# Patient Record
Sex: Male | Born: 2000 | Race: White | Hispanic: No | Marital: Single
Health system: Southern US, Community
[De-identification: ages and names within clinical notes are randomized; demographics above are authoritative.]

## PROBLEM LIST (undated history)

## (undated) DIAGNOSIS — L709 Acne, unspecified: Secondary | ICD-10-CM

## (undated) HISTORY — DX: Acne, unspecified: L70.9

---
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
## 2021-01-09 ENCOUNTER — Ambulatory Visit: Payer: BC Managed Care – PPO | Admitting: Dermatology

## 2021-01-09 ENCOUNTER — Other Ambulatory Visit: Payer: Self-pay

## 2021-01-09 VITALS — Wt 145.0 lb

## 2021-01-09 DIAGNOSIS — L853 Xerosis cutis: Secondary | ICD-10-CM | POA: Diagnosis not present

## 2021-01-09 DIAGNOSIS — L700 Acne vulgaris: Secondary | ICD-10-CM | POA: Diagnosis not present

## 2021-01-09 DIAGNOSIS — K130 Diseases of lips: Secondary | ICD-10-CM | POA: Diagnosis not present

## 2021-01-09 DIAGNOSIS — Z79899 Other long term (current) drug therapy: Secondary | ICD-10-CM

## 2021-01-09 MED ORDER — ISOTRETINOIN 30 MG PO CAPS: 60.0000 mg | ORAL_CAPSULE | Freq: Every day | ORAL | 0 refills | Status: DC

## 2021-01-09 NOTE — Patient Instructions (Signed)

## 2021-01-09 NOTE — Progress Notes (Signed)
   Follow-Up Visit   Subjective  Brady Clark is a 20 y.o. male who presents for the following: Acne (Isotretinoin 30mg  2 po QD - patient states that he has no side effects at this time).  The following portions of the chart were reviewed this encounter and updated as appropriate:   Allergies  Meds  Problems  Med Hx  Surg Hx  Fam Hx     Review of Systems:  No other skin or systemic complaints except as noted in HPI or Assessment and Plan.  Objective  Well appearing patient in no apparent distress; mood and affect are within normal limits.  A focused examination was performed including the face. Relevant physical exam findings are noted in the Assessment and Plan.  Objective  Face: One resolving papule and one small active papule.   Assessment & Plan  Acne vulgaris Face Isotretinoin week 20 - Acne is chronic, severe, not at goal. Severe; On Isotretinoin -  requiring FDA mandated monthly evaluations and laboratory monitoring; Chronic and Persistent; Not to Goal  While taking isotretinoin, do not share pills and do not donate blood. Isotretinoin is best absorbed when taken with a fatty meal. Isotretinoin can make you sensitive to the sun. Daily careful sun protection including sunscreen SPF 30+ when outdoors is recommended.   Continue Isotretinoin 30mg  2 po QD take with food. Recommend continuing treatment for at least 2 more months.   Total dose 95.7mg /kg - discussed with patient ideally he should reach at least 125mg /kg before stopping treatment.   Reordered Medications ISOtretinoin (ACCUTANE) 30 MG capsule   Isotretinoin F/U - 01/09/21 1000      Isotretinoin Follow Up   iPledge #    Date 01/09/21    Weight 145 lb (65.8 kg)    Acne breakouts since last visit? Yes      Dosage   Target Dosage (mg) 01/11/21    Current (To Date) Dosage (mg) 6,300    To Go Dosage (mg) 3,585      Side Effects   Skin WNL    Gastrointestinal WNL    Neurological WNL     Constitutional WNL          Cheilitis Secondary to Isotretinoin  - Continue lip balm as directed, Dr. [phone] Cortibalm or Aquaphor recommended - Continue HC 2.5% ointment QD PRN.  Xerosis Secondary to Isotretinoin  - diffuse xerotic patches - recommend gentle, hydrating skin care - gentle skin care handout given  Return in about 1 month (around 02/08/2021) for Isotretinoin follow up .  8,341, CMA, am acting as scribe for Clayborne Artist, MD .  Documentation: I have reviewed the above documentation for accuracy and completeness, and I agree with the above.  02/10/2021, MD

## 2021-01-11 ENCOUNTER — Encounter: Payer: Self-pay | Admitting: Dermatology

## 2021-02-11 ENCOUNTER — Ambulatory Visit: Payer: BC Managed Care – PPO | Admitting: Dermatology

## 2021-02-11 ENCOUNTER — Other Ambulatory Visit: Payer: Self-pay

## 2021-02-11 VITALS — Wt 145.0 lb

## 2021-02-11 DIAGNOSIS — Z79899 Other long term (current) drug therapy: Secondary | ICD-10-CM

## 2021-02-11 DIAGNOSIS — K130 Diseases of lips: Secondary | ICD-10-CM | POA: Diagnosis not present

## 2021-02-11 DIAGNOSIS — L853 Xerosis cutis: Secondary | ICD-10-CM

## 2021-02-11 DIAGNOSIS — L700 Acne vulgaris: Secondary | ICD-10-CM

## 2021-02-11 MED ORDER — ISOTRETINOIN 30 MG PO CAPS: 60.0000 mg | ORAL_CAPSULE | Freq: Every day | ORAL | 0 refills | Status: DC

## 2021-02-11 NOTE — Progress Notes (Signed)
   Isotretinoin Follow-Up Visit   Subjective  Brady Clark is a 20 y.o. male who presents for the following: Acne (Isotretinoin week 24 - 30mg  2 po QD, patient c/o hip/joint pain ).  Week # 24   Isotretinoin F/U - 02/11/21 1600      Isotretinoin Follow Up   iPledge # 02/13/21    Date 02/11/21    Weight 145 lb (65.8 kg)    Acne breakouts since last visit? No      Dosage   Target Dosage (mg) 9885    Current (To Date) Dosage (mg) 8,100    To Go Dosage (mg) 1,735      Side Effects   Skin WNL    Gastrointestinal WNL    Neurological WNL    Constitutional Other   hip/joint pain           Side effects: Dry skin, dry lips  Denies changes in night vision, shortness of breath, abdominal pain, nausea, vomiting, diarrhea, blood in stool or urine, visual changes, headaches, epistaxis, joint pain, myalgias, mood changes, depression, or suicidal ideation.   The following portions of the chart were reviewed this encounter and updated as appropriate: medications, allergies, medical history  Review of Systems:  No other skin or systemic complaints except as noted in HPI or Assessment and Plan.  Objective  Well appearing patient in no apparent distress; mood and affect are within normal limits.  An examination of the face, neck, chest, and back was performed and relevant findings are noted below.   Objective  Face: Clear.    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 Severe; On Isotretinoin -  requiring FDA mandated monthly evaluations and laboratory monitoring; Chronic and Persistent; Not to Goal  While taking isotretinoin, do not share pills and do not donate blood. Isotretinoin is best absorbed when taken with a fatty meal. Isotretinoin can make you sensitive to the sun. Daily careful sun protection including sunscreen SPF 30+ when outdoors is recommended.  Continue Isotretinoin 30mg  2 po QD. #60 0RF. This will be the patient's last  month of treatment   Total dose to date 123.1mg /kg - recommended at least 125mg /kg   Reordered Medications ISOtretinoin (ACCUTANE) 30 MG capsule   Xerosis secondary to isotretinoin therapy - Continue emollients as directed  Cheilitis secondary to isotretinoin therapy - Continue lip balm as directed, Dr. 02/13/21 Cortibalm recommended  Long term medication management (isotretinoin) - While taking Isotretinoin and for 30 days after you finish the medication, do not share pills, do not donate blood. Isotretinoin is best absorbed when taken with a fatty meal. Isotretinoin can make you sensitive to the sun. Daily careful sun protection including sunscreen SPF 30+ when outdoors is recommended.  , CMA, am acting as scribe for , MD .  Documentation: I have reviewed the above documentation for accuracy and completeness, and I agree with the above.  Clayborne Artist, MD

## 2021-02-11 NOTE — Patient Instructions (Signed)

## 2021-02-15 ENCOUNTER — Encounter: Payer: Self-pay | Admitting: Dermatology

## 2021-08-21 ENCOUNTER — Other Ambulatory Visit: Payer: Self-pay

## 2021-08-21 ENCOUNTER — Ambulatory Visit: Payer: BC Managed Care – PPO | Admitting: Dermatology

## 2021-08-21 DIAGNOSIS — L710 Perioral dermatitis: Secondary | ICD-10-CM | POA: Diagnosis not present

## 2021-08-21 DIAGNOSIS — L700 Acne vulgaris: Secondary | ICD-10-CM | POA: Diagnosis not present

## 2021-08-21 DIAGNOSIS — L309 Dermatitis, unspecified: Secondary | ICD-10-CM

## 2021-08-21 NOTE — Patient Instructions (Addendum)
If You Need Anything After Your Visit ° °If you have any questions or concerns for your doctor, please call our main line at [phone] and press option 4 to reach your doctor's medical assistant. If no one answers, please leave a voicemail as directed and we will return your call as soon as possible. Messages left after 4 pm will be answered the following business day.  ° °You may also send us a message via MyChart. We typically respond to MyChart messages within 1-2 business days. ° °For prescription refills, please ask your pharmacy to contact our office. Our fax number is [phone]. ° °If you have an urgent issue when the clinic is closed that cannot wait until the next business day, you can page your doctor at the number below.   ° °Please note that while we do our best to be available for urgent issues outside of office hours, we are not available 24/7.  ° °If you have an urgent issue and are unable to reach us, you may choose to seek medical care at your doctor's office, retail clinic, urgent care center, or emergency room. ° °If you have a medical emergency, please immediately call 911 or go to the emergency department. ° °Pager Numbers ° °- Dr. Kowalski: [phone] ° °- Dr. Moye: [phone] ° °- Dr. Stewart: [phone] ° °In the event of inclement weather, please call our main line at [phone] for an update on the status of any delays or closures. ° °Dermatology Medication Tips: °Please keep the boxes that topical medications come in in order to help keep track of the instructions about where and how to use these. Pharmacies typically print the medication instructions only on the boxes and not directly on the medication tubes.  ° °If your medication is too expensive, please contact our office at [phone] option 4 or send us a message through MyChart.  ° °We are unable to tell what your co-pay for medications will be in advance as this is different depending on your insurance coverage.  However, we may be able to find a substitute medication at lower cost or fill out paperwork to get insurance to cover a needed medication.  ° °If a prior authorization is required to get your medication covered by your insurance company, please allow us 1-2 business days to complete this process. ° °Drug prices often vary depending on where the prescription is filled and some pharmacies may offer cheaper prices. ° °The website www.goodrx.com contains coupons for medications through different pharmacies. The prices here do not account for what the cost may be with help from insurance (it may be cheaper with your insurance), but the website can give you the price if you did not use any insurance.  °- You can print the associated coupon and take it with your prescription to the pharmacy.  °- You may also stop by our office during regular business hours and pick up a GoodRx coupon card.  °- If you need your prescription sent electronically to a different pharmacy, notify our office through [facility] MyChart or by phone at [phone] option 4. ° ° ° ° °Si Usted Necesita Algo Después de Su Visita ° °También puede enviarnos un mensaje a través de MyChart. Por lo general respondemos a los mensajes de MyChart en el transcurso de 1 a 2 días hábiles. ° °Para renovar recetas, por favor pida a su farmacia que se ponga en contacto con nuestra oficina. Nuestro número de fax es el [phone]. ° °Si tiene   un asunto urgente cuando la clínica esté cerrada y que no puede esperar hasta el siguiente día hábil, puede llamar/localizar a su doctor(a) al número que aparece a continuación.  ° °Por favor, tenga en cuenta que aunque hacemos todo lo posible para estar disponibles para asuntos urgentes fuera del horario de oficina, no estamos disponibles las 24 horas del día, los 7 días de la semana.  ° °Si tiene un problema urgente y no puede comunicarse con nosotros, puede optar por buscar atención médica  en el consultorio de su  doctor(a), en una clínica privada, en un centro de atención urgente o en una sala de emergencias. ° °Si tiene una emergencia médica, por favor llame inmediatamente al 911 o vaya a la sala de emergencias. ° °Números de bíper ° °- Dr. Kowalski: [phone] ° °- Dra. Moye: [phone] ° °- Dra. Stewart: [phone] ° °En caso de inclemencias del tiempo, por favor llame a nuestra línea principal al [phone] para una actualización sobre el estado de cualquier retraso o cierre. ° °Consejos para la medicación en dermatología: °Por favor, guarde las cajas en las que vienen los medicamentos de uso tópico para ayudarle a seguir las instrucciones sobre dónde y cómo usarlos. Las farmacias generalmente imprimen las instrucciones del medicamento sólo en las cajas y no directamente en los tubos del medicamento.  ° °Si su medicamento es muy caro, por favor, póngase en contacto con nuestra oficina llamando al [phone] y presione la opción 4 o envíenos un mensaje a través de MyChart.  ° °No podemos decirle cuál será su copago por los medicamentos por adelantado ya que esto es diferente dependiendo de la cobertura de su seguro. Sin embargo, es posible que podamos encontrar un medicamento sustituto a menor costo o llenar un formulario para que el seguro cubra el medicamento que se considera necesario.  ° °Si se requiere una autorización previa para que su compañía de seguros cubra su medicamento, por favor permítanos de 1 a 2 días hábiles para completar este proceso. ° °Los precios de los medicamentos varían con frecuencia dependiendo del lugar de dónde se surte la receta y alguna farmacias pueden ofrecer precios más baratos. ° °El sitio web www.goodrx.com tiene cupones para medicamentos de diferentes farmacias. Los precios aquí no tienen en cuenta lo que podría costar con la ayuda del seguro (puede ser más barato con su seguro), pero el sitio web puede darle el precio si no utilizó ningún seguro.  °- Puede imprimir el cupón  correspondiente y llevarlo con su receta a la farmacia.  °- También puede pasar por nuestra oficina durante el horario de atención regular y recoger una tarjeta de cupones de GoodRx.  °- Si necesita que su receta se envíe electrónicamente a una farmacia diferente, informe a nuestra oficina a través de MyChart de  o por teléfono llamando al [phone] y presione la opción 4. ° °Instructions for [facility] Medications ° °One or more of your medications was sent to the [facility] mail order compounding pharmacy. You will receive an email from them and can purchase the medicine through that link. It will then be mailed to your home at the address you confirmed. If for any reason you do not receive an email from them, please check your spam folder. If you still do not find the email, please let us know. [facility] phone number is [phone].  ° °

## 2021-08-21 NOTE — Progress Notes (Signed)
   Follow-Up Visit   Subjective  Brady Clark is a 20 y.o. male who presents for the following: Acne (Face, 28m s/p Isotretinoin, pt currently uses Proactive prn flares).  The following portions of the chart were reviewed this encounter and updated as appropriate:   Allergies  Meds  Problems  Med Hx  Surg Hx  Fam Hx     Review of Systems:  No other skin or systemic complaints except as noted in HPI or Assessment and Plan.  Objective  Well appearing patient in no apparent distress; mood and affect are within normal limits.  A focused examination was performed including face. Relevant physical exam findings are noted in the Assessment and Plan.  face Pinkness on face otherwise clear  perinasal Pink paps perinasal   Assessment & Plan  Acne vulgaris face S/P Isotretinoin, good results Cont Proactive prn flares  Perioral dermatitis perinasal Perioral dermatitis is an eruption which is usually located around the mouth and nose.  It can be a rash and/or red bumps.  It occasionally occurs around the eyes.  It may be itchy and may burn.  The exact cause is unknown.  Some types of makeup, moisturizers, dental products, and prescription creams may be partially responsible for the eruption.  Topical steroids such as cortisone creams can temporarily make the rash better but with discontinuation the rash tends to recur and worsen, so they should be avoided. Topical antibiotics, elidel cream, protopic ointment, and oral antibiotics may be prescribed to treat this condition.  Although perioral dermatitis is not an infection, some antibiotics have anti-inflammatory properties that help it greatly.   Start Sulfacetamide: 9%, Sulfur: 3% wash qd/bid until clear then prn flares (sent to [facility])  If not improving with wash will send in Avar cream and if that not covered can try Aczone, or oral doxycycline  Return in about 1 year (around 08/21/2022) for Acne f/u.  I, Ardis Rowan,  RMA, am acting as scribe for Armida Sans, MD . Documentation: I have reviewed the above documentation for accuracy and completeness, and I agree with the above.  Armida Sans, MD

## 2021-08-27 ENCOUNTER — Encounter: Payer: Self-pay | Admitting: Dermatology

## 2022-09-23 ENCOUNTER — Telehealth: Payer: Self-pay

## 2022-09-23 DIAGNOSIS — L700 Acne vulgaris: Secondary | ICD-10-CM

## 2022-09-23 MED ORDER — ADAPALENE 0.3 % EX GEL: CUTANEOUS | 4 refills | Status: DC

## 2022-09-23 NOTE — Telephone Encounter (Signed)
Patient called requesting refills of [facility] sulfa wash and Adapalene 0.3% gel. Advised patient and mother that patient must schedule a yearly appointment for prescription refills. Appointment scheduled and refills sent in to last him until that appointment.

## 2023-02-04 ENCOUNTER — Ambulatory Visit: Payer: BC Managed Care – PPO | Admitting: Dermatology

## 2023-02-04 VITALS — BP 110/67

## 2023-02-04 DIAGNOSIS — L700 Acne vulgaris: Secondary | ICD-10-CM

## 2023-02-04 DIAGNOSIS — L709 Acne, unspecified: Secondary | ICD-10-CM | POA: Diagnosis not present

## 2023-02-04 DIAGNOSIS — L710 Perioral dermatitis: Secondary | ICD-10-CM

## 2023-02-04 DIAGNOSIS — Z7189 Other specified counseling: Secondary | ICD-10-CM

## 2023-02-04 DIAGNOSIS — Z79899 Other long term (current) drug therapy: Secondary | ICD-10-CM

## 2023-02-04 NOTE — Patient Instructions (Signed)
Due to recent changes in healthcare laws, you may see results of your pathology and/or laboratory studies on MyChart before the doctors have had a chance to review them. We understand that in some cases there may be results that are confusing or concerning to you. Please understand that not all results are received at the same time and often the doctors may need to interpret multiple results in order to provide you with the best plan of care or course of treatment. Therefore, we ask that you please give us 2 business days to thoroughly review all your results before contacting the office for clarification. Should we see a critical lab result, you will be contacted sooner.   If You Need Anything After Your Visit  If you have any questions or concerns for your doctor, please call our main line at [phone] and press option 4 to reach your doctor's medical assistant. If no one answers, please leave a voicemail as directed and we will return your call as soon as possible. Messages left after 4 pm will be answered the following business day.   You may also send us a message via MyChart. We typically respond to MyChart messages within 1-2 business days.  For prescription refills, please ask your pharmacy to contact our office. Our fax number is [phone].  If you have an urgent issue when the clinic is closed that cannot wait until the next business day, you can page your doctor at the number below.    Please note that while we do our best to be available for urgent issues outside of office hours, we are not available 24/7.   If you have an urgent issue and are unable to reach us, you may choose to seek medical care at your doctor's office, retail clinic, urgent care center, or emergency room.  If you have a medical emergency, please immediately call 911 or go to the emergency department.  Pager Numbers  - Dr. Kowalski: [phone]  - Dr. Moye: [phone]  - Dr. Stewart:  [phone]  In the event of inclement weather, please call our main line at [phone] for an update on the status of any delays or closures.  Dermatology Medication Tips: Please keep the boxes that topical medications come in in order to help keep track of the instructions about where and how to use these. Pharmacies typically print the medication instructions only on the boxes and not directly on the medication tubes.   If your medication is too expensive, please contact our office at [phone] option 4 or send us a message through MyChart.   We are unable to tell what your co-pay for medications will be in advance as this is different depending on your insurance coverage. However, we may be able to find a substitute medication at lower cost or fill out paperwork to get insurance to cover a needed medication.   If a prior authorization is required to get your medication covered by your insurance company, please allow us 1-2 business days to complete this process.  Drug prices often vary depending on where the prescription is filled and some pharmacies may offer cheaper prices.  The website www.goodrx.com contains coupons for medications through different pharmacies. The prices here do not account for what the cost may be with help from insurance (it may be cheaper with your insurance), but the website can give you the price if you did not use any insurance.  - You can print the associated coupon and take it with   your prescription to the pharmacy.  - You may also stop by our office during regular business hours and pick up a GoodRx coupon card.  - If you need your prescription sent electronically to a different pharmacy, notify our office through [facility] MyChart or by phone at [phone] option 4.     Si Usted Necesita Algo Despus de Su Visita  Tambin puede enviarnos un mensaje a travs de MyChart. Por lo general respondemos a los mensajes de MyChart en el transcurso de 1 a 2  das hbiles.  Para renovar recetas, por favor pida a su farmacia que se ponga en contacto con nuestra oficina. Nuestro nmero de fax es el [phone].  Si tiene un asunto urgente cuando la clnica est cerrada y que no puede esperar hasta el siguiente da hbil, puede llamar/localizar a su doctor(a) al nmero que aparece a continuacin.   Por favor, tenga en cuenta que aunque hacemos todo lo posible para estar disponibles para asuntos urgentes fuera del horario de oficina, no estamos disponibles las 24 horas del da, los 7 das de la semana.   Si tiene un problema urgente y no puede comunicarse con nosotros, puede optar por buscar atencin mdica  en el consultorio de su doctor(a), en una clnica privada, en un centro de atencin urgente o en una sala de emergencias.  Si tiene una emergencia mdica, por favor llame inmediatamente al 911 o vaya a la sala de emergencias.  Nmeros de bper  - Dr. Kowalski: [phone]  - Dra. Moye: [phone]  - Dra. Stewart: [phone]  En caso de inclemencias del tiempo, por favor llame a nuestra lnea principal al [phone] para una actualizacin sobre el estado de cualquier retraso o cierre.  Consejos para la medicacin en dermatologa: Por favor, guarde las cajas en las que vienen los medicamentos de uso tpico para ayudarle a seguir las instrucciones sobre dnde y cmo usarlos. Las farmacias generalmente imprimen las instrucciones del medicamento slo en las cajas y no directamente en los tubos del medicamento.   Si su medicamento es muy caro, por favor, pngase en contacto con nuestra oficina llamando al [phone] y presione la opcin 4 o envenos un mensaje a travs de MyChart.   No podemos decirle cul ser su copago por los medicamentos por adelantado ya que esto es diferente dependiendo de la cobertura de su seguro. Sin embargo, es posible que podamos encontrar un medicamento sustituto a menor costo o llenar un formulario para que el  seguro cubra el medicamento que se considera necesario.   Si se requiere una autorizacin previa para que su compaa de seguros cubra su medicamento, por favor permtanos de 1 a 2 das hbiles para completar este proceso.  Los precios de los medicamentos varan con frecuencia dependiendo del lugar de dnde se surte la receta y alguna farmacias pueden ofrecer precios ms baratos.  El sitio web www.goodrx.com tiene cupones para medicamentos de diferentes farmacias. Los precios aqu no tienen en cuenta lo que podra costar con la ayuda del seguro (puede ser ms barato con su seguro), pero el sitio web puede darle el precio si no utiliz ningn seguro.  - Puede imprimir el cupn correspondiente y llevarlo con su receta a la farmacia.  - Tambin puede pasar por nuestra oficina durante el horario de atencin regular y recoger una tarjeta de cupones de GoodRx.  - Si necesita que su receta se enve electrnicamente a una farmacia diferente, informe a nuestra oficina a travs de MyChart de [facility]   o por telfono llamando al [phone] y presione la opcin 4.  

## 2023-02-04 NOTE — Progress Notes (Signed)
   Follow-Up Visit   Subjective  Brady Clark is a 22 y.o. male who presents for the following: Perioral dermatitis follow up of perinasal area - Sulfa wash from [facility]. History of acne treated with Isotretinoin  The following portions of the chart were reviewed this encounter and updated as appropriate: medications, allergies, medical history  Review of Systems:  No other skin or systemic complaints except as noted in HPI or Assessment and Plan.  Objective  Well appearing patient in no apparent distress; mood and affect are within normal limits. A focused examination was performed of the following areas: Face Relevant exam findings are noted in the Assessment and Plan.   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 Condition is symptomatic / bothersome to patient. Not to goal, but improved.  Continue sulfur wash from [facility] Patient declines  more aggressive treatment.  Acne Status post isotretinoin course Currently doing well with a few comedones. Patient declines any further treatment.  Return in about 1 year (around 02/04/2024) for Acne.  Documentation: I have reviewed the above documentation for accuracy and completeness, and I agree with the above.  Armida Sans, MD

## 2023-02-11 ENCOUNTER — Encounter: Payer: Self-pay | Admitting: Dermatology

## 2023-03-20 ENCOUNTER — Encounter: Payer: Self-pay | Admitting: Dermatology

## 2023-03-23 ENCOUNTER — Other Ambulatory Visit: Payer: Self-pay

## 2023-03-23 DIAGNOSIS — L700 Acne vulgaris: Secondary | ICD-10-CM

## 2023-03-23 MED ORDER — ADAPALENE 0.3 % EX GEL: CUTANEOUS | 4 refills | Status: DC

## 2024-02-04 ENCOUNTER — Ambulatory Visit (INDEPENDENT_AMBULATORY_CARE_PROVIDER_SITE_OTHER): Payer: PRIVATE HEALTH INSURANCE | Admitting: Dermatology

## 2024-02-04 DIAGNOSIS — L710 Perioral dermatitis: Secondary | ICD-10-CM | POA: Diagnosis not present

## 2024-02-04 DIAGNOSIS — B078 Other viral warts: Secondary | ICD-10-CM

## 2024-02-04 DIAGNOSIS — L700 Acne vulgaris: Secondary | ICD-10-CM

## 2024-02-04 DIAGNOSIS — Z7189 Other specified counseling: Secondary | ICD-10-CM | POA: Diagnosis not present

## 2024-02-04 DIAGNOSIS — Z79899 Other long term (current) drug therapy: Secondary | ICD-10-CM

## 2024-02-04 MED ORDER — ADAPALENE 0.3 % EX GEL: CUTANEOUS | 6 refills | Status: AC

## 2024-02-04 NOTE — Progress Notes (Signed)
   Follow-Up Visit   Subjective  [name] is a 23 y.o. male who presents for the following: yearly f/u on acne treating with Adapalene  in the morning and proactiv at bedtime with a good response.  The patient has spots, moles and lesions to be evaluated, some may be new or changing and the patient may have concern these could be cancer.  The following portions of the chart were reviewed this encounter and updated as appropriate: medications, allergies, medical history  Review of Systems:  No other skin or systemic complaints except as noted in HPI or Assessment and Plan.  Objective  Well appearing patient in no apparent distress; mood and affect are within normal limits.  A focused examination was performed of the following areas: face  Relevant exam findings are noted in the Assessment and Plan.  left base of thumb x 1, left ring finger x 1 (2) Verrucous papules -- Discussed viral etiology and contagion.   Assessment & Plan   ACNE VULGARIS S/P course of Isotretinoin  in past Exam: mainly clear on his face Chronic condition with duration or expected duration over one year. Currently well-controlled.  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 Condition is symptomatic / bothersome to patient. Not to goal, but improved.   Continue sulfur  wash from [facility]  OTHER VIRAL WARTS (2) left base of thumb x 1, left ring finger x 1 (2) Viral Wart (HPV) Counseling  Discussed viral / HPV (Human Papilloma Virus) etiology and risk of spread /infectivity to other areas of body as well as to other people.  Multiple treatments and methods may be required to clear warts and it is possible treatment may not be successful.  Treatment risks include discoloration; scarring and there is still potential  for wart recurrence.  Destruction of lesion - left base of thumb x 1, left ring finger x 1 (2) Complexity: simple   Destruction method: cryotherapy   Informed consent: discussed and consent obtained   Timeout:  patient name, date of birth, surgical site, and procedure verified Lesion destroyed using liquid nitrogen: Yes   Region frozen until ice ball extended beyond lesion: Yes   Outcome: patient tolerated procedure well with no complications   Post-procedure details: wound care instructions given   ACNE VULGARIS   Related Medications Adapalene  (DIFFERIN ) 0.3 % gel Apply a thin coat to the face QHS. COUNSELING AND COORDINATION OF CARE   MEDICATION MANAGEMENT   PERIORAL DERMATITIS    Return in about 1 year (around 02/03/2025) for Acne .  IClara Clark, CMA, am acting as scribe for Brady Collard, MD .   Documentation: I have reviewed the above documentation for accuracy and completeness, and I agree with the above.  Brady Collard, MD

## 2024-02-04 NOTE — Patient Instructions (Addendum)

## 2024-02-08 ENCOUNTER — Encounter: Payer: Self-pay | Admitting: Dermatology

## 2025-02-09 ENCOUNTER — Ambulatory Visit: Admitting: Dermatology

## 2025-03-02 ENCOUNTER — Ambulatory Visit: Admitting: Dermatology
